# Patient Record
Sex: Female | Born: 1985 | Race: Black or African American | Hispanic: No | Marital: Single | Smoking: Current every day smoker
Health system: Southern US, Community
[De-identification: ages and names within clinical notes are randomized; demographics above are authoritative.]

## PROBLEM LIST (undated history)

## (undated) DIAGNOSIS — G43909 Migraine, unspecified, not intractable, without status migrainosus: Secondary | ICD-10-CM

## (undated) HISTORY — DX: Migraine, unspecified, not intractable, without status migrainosus: G43.909

## (undated) HISTORY — PX: TONSILLECTOMY: SUR1361

---

## 2004-08-12 ENCOUNTER — Emergency Department: Payer: Self-pay | Admitting: Emergency Medicine

## 2005-01-12 ENCOUNTER — Emergency Department: Payer: Self-pay | Admitting: Internal Medicine

## 2005-01-13 ENCOUNTER — Emergency Department: Payer: Self-pay | Admitting: Emergency Medicine

## 2005-05-10 ENCOUNTER — Ambulatory Visit: Payer: Self-pay | Admitting: Unknown Physician Specialty

## 2005-08-01 ENCOUNTER — Emergency Department: Payer: Self-pay | Admitting: Unknown Physician Specialty

## 2005-11-25 ENCOUNTER — Emergency Department: Payer: Self-pay | Admitting: Emergency Medicine

## 2006-04-30 ENCOUNTER — Emergency Department: Payer: Self-pay | Admitting: Unknown Physician Specialty

## 2007-05-04 ENCOUNTER — Observation Stay: Payer: Self-pay | Admitting: Obstetrics and Gynecology

## 2007-11-22 ENCOUNTER — Emergency Department: Payer: Self-pay | Admitting: Emergency Medicine

## 2008-05-05 ENCOUNTER — Emergency Department: Payer: Self-pay | Admitting: Emergency Medicine

## 2008-06-11 ENCOUNTER — Emergency Department: Payer: Self-pay | Admitting: Emergency Medicine

## 2008-07-05 ENCOUNTER — Emergency Department: Payer: Self-pay | Admitting: Unknown Physician Specialty

## 2008-07-24 ENCOUNTER — Emergency Department: Payer: Self-pay | Admitting: Emergency Medicine

## 2008-08-27 ENCOUNTER — Emergency Department: Payer: Self-pay | Admitting: Emergency Medicine

## 2008-09-04 ENCOUNTER — Emergency Department: Payer: Self-pay | Admitting: Emergency Medicine

## 2008-11-06 ENCOUNTER — Emergency Department: Payer: Self-pay | Admitting: Emergency Medicine

## 2008-12-18 ENCOUNTER — Emergency Department: Payer: Self-pay | Admitting: Emergency Medicine

## 2008-12-28 ENCOUNTER — Emergency Department: Payer: Self-pay | Admitting: Emergency Medicine

## 2009-03-06 ENCOUNTER — Emergency Department: Payer: Self-pay | Admitting: Emergency Medicine

## 2009-04-07 ENCOUNTER — Emergency Department: Payer: Self-pay | Admitting: Emergency Medicine

## 2009-04-12 ENCOUNTER — Emergency Department: Payer: Self-pay | Admitting: Emergency Medicine
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
## 2010-12-12 IMAGING — CR RIGHT FOREARM - 2 VIEW
1 series · 2 of 2 positions shown · non-contrast
Comparison: none

REASON FOR EXAM: pain
COMMENTS:

PROCEDURE:     DXR - DXR FOREARM RIGHT  - April 07, 2009  [DATE]
RESULT:     No fracture, dislocation or other acute bony abnormality is
identified.

[Series 1: view not recorded · 0.17mm/px · 2 of 2 slices shown]
[im 1/2]
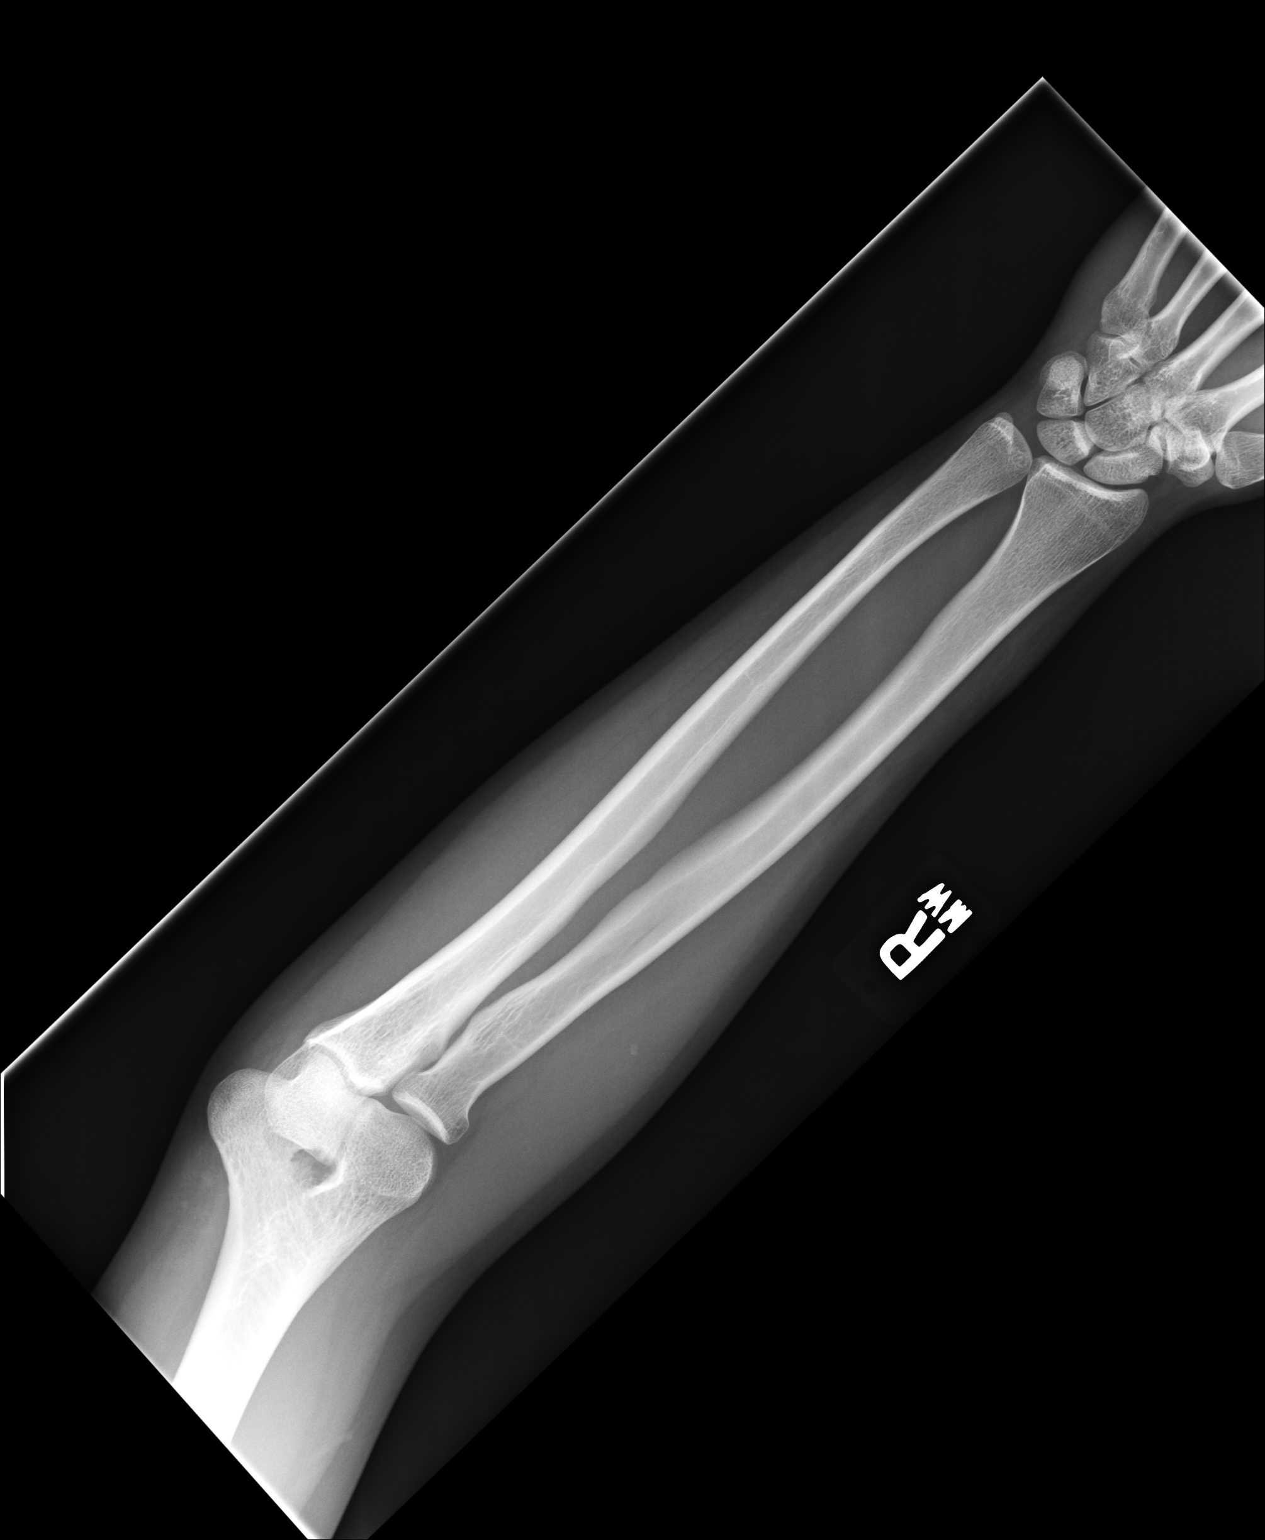
[im 2/2]
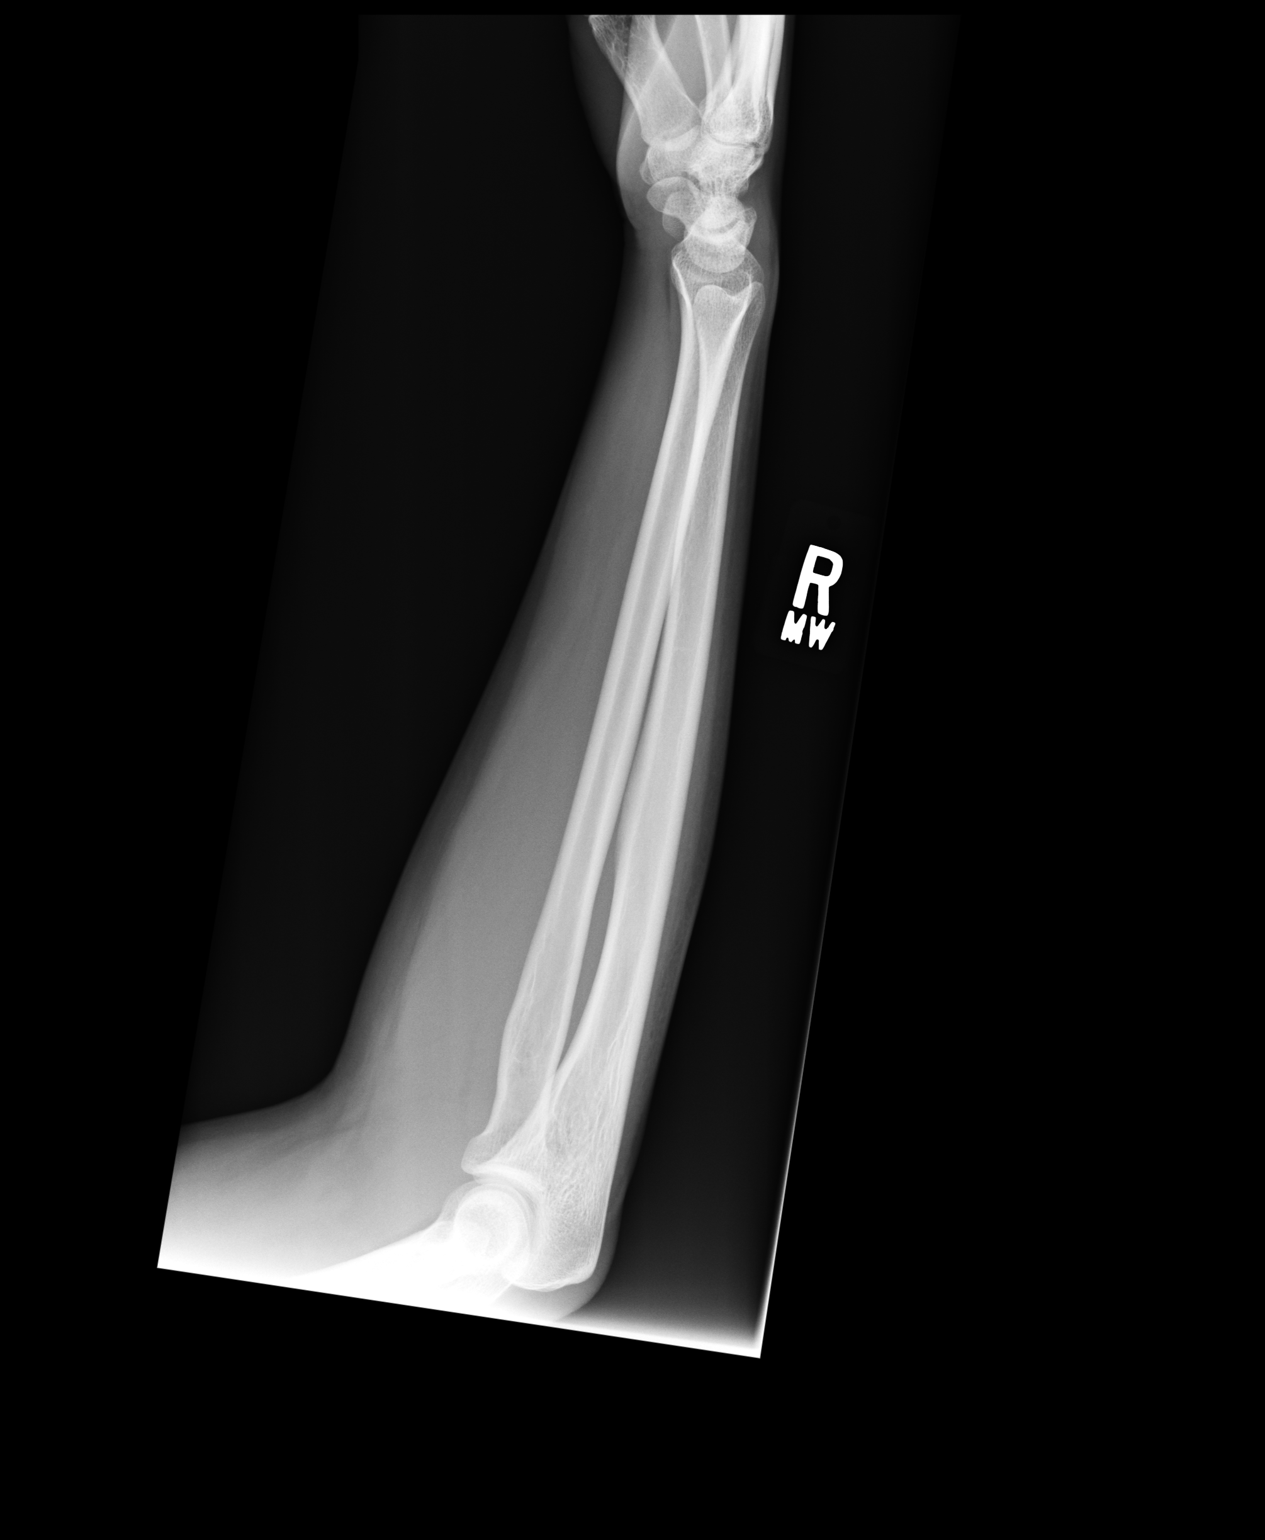

[2 of 2 positions shown; findings below may reference images not displayed]

IMPRESSION: 1.     No significant osseous abnormalities are noted.

## 2020-11-10 ENCOUNTER — Other Ambulatory Visit: Payer: Self-pay

## 2020-11-10 ENCOUNTER — Ambulatory Visit
Admission: EM | Admit: 2020-11-10 | Discharge: 2020-11-10 | Disposition: A | Discharge status: Home / Self Care | Payer: Self-pay | Attending: Family Medicine | Admitting: Family Medicine

## 2020-11-10 DIAGNOSIS — M7918 Myalgia, other site: Secondary | ICD-10-CM

## 2020-11-10 MED ORDER — TIZANIDINE HCL 4 MG PO TABS: 4.0000 mg | ORAL_TABLET | Freq: Three times a day (TID) | ORAL | 0 refills | Status: DC | PRN

## 2020-11-10 MED ORDER — MELOXICAM 15 MG PO TABS: 15.0000 mg | ORAL_TABLET | Freq: Every day | ORAL | 0 refills | Status: DC | PRN
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
## 2020-11-10 NOTE — Discharge Instructions (Addendum)
Rest.  Heat.  Medications as needed.  Take care  Dr. Adriana Simas

## 2020-11-18 ENCOUNTER — Encounter: Payer: Self-pay | Admitting: Advanced Practice Midwife

## 2020-11-18 ENCOUNTER — Ambulatory Visit: Payer: Self-pay

## 2020-11-18 ENCOUNTER — Ambulatory Visit (LOCAL_COMMUNITY_HEALTH_CENTER): Payer: Self-pay | Admitting: Advanced Practice Midwife

## 2020-11-18 ENCOUNTER — Other Ambulatory Visit: Payer: Self-pay

## 2020-11-18 VITALS — BP 122/69 | Ht 70.6 in | Wt 162.4 lb

## 2020-11-18 DIAGNOSIS — F17200 Nicotine dependence, unspecified, uncomplicated: Secondary | ICD-10-CM | POA: Insufficient documentation

## 2020-11-18 DIAGNOSIS — Z3009 Encounter for other general counseling and advice on contraception: Secondary | ICD-10-CM

## 2020-11-18 DIAGNOSIS — F1290 Cannabis use, unspecified, uncomplicated: Secondary | ICD-10-CM | POA: Insufficient documentation

## 2020-11-18 DIAGNOSIS — A599 Trichomoniasis, unspecified: Secondary | ICD-10-CM

## 2020-11-18 DIAGNOSIS — Z3046 Encounter for surveillance of implantable subdermal contraceptive: Secondary | ICD-10-CM

## 2020-11-18 LAB — HEMOGLOBIN, FINGERSTICK: Hemoglobin: 12.5 g/dL (ref 11.1–15.9)

## 2020-11-18 LAB — PREGNANCY, URINE: Preg Test, Ur: NEGATIVE

## 2020-11-18 LAB — HM HEPATITIS C SCREENING LAB: HM Hepatitis Screen: NEGATIVE

## 2020-11-18 LAB — WET PREP FOR TRICH, YEAST, CLUE
Trichomonas Exam: POSITIVE — AB
Yeast Exam: NEGATIVE

## 2020-11-18 LAB — HM HIV SCREENING LAB: HM HIV Screening: NEGATIVE

## 2020-11-18 MED ORDER — NORETHIN ACE-ETH ESTRAD-FE 1-20 MG-MCG PO TABS: 1.0000 | ORAL_TABLET | Freq: Every day | ORAL | 0 refills | Status: DC

## 2020-11-18 NOTE — Progress Notes (Signed)
Pt here for PE, Nexplanon removal/insertion.  Wet mount results reviewed, medication given per SO.  Pt declined condoms.  Pt rescheduled for Nexplanon removal/insertion for 8/18.  Pt given Microgestin FE 1/20 #1. Berdie Ogren, RN

## 2020-11-18 NOTE — Progress Notes (Signed)
[facility] DEPARTMENT [facility] [address] Main Number: [phone]    Family Planning Visit- Initial Visit  Subjective:  ANEESA ROMEY is a 35 y.o.  [id number]   being seen today for an initial annual visit and to discuss contraceptive options.  The patient is currently using Nexplanon for pregnancy prevention. Patient reports she does not want a pregnancy in the next year.  Patient has the following medical conditions has Smoker 1 ppd on their problem list.  Chief Complaint  Patient presents with   Annual Exam    PE, Nexplanon removal/insertion    Patient reports here for Nexplanon removal/reinsertion and physical. Nexplanon inserted at [facility] and pt states they told her it was good for 5 years. LMP 10/29/20. Last sex 11/13/20 without condom; with current partner x 8 years; 1 sex partner in last 3 mo. Daily MJ 1 gm. Smoking 1 ppd. Last cigar 20 years ago. Pt states last pap 2021 [facility] but not in Epic. Last ETOH 20 years ago. Not working. Not in school. Living with her mom and her 2 kids.  Patient denies vaping, cigars, ETOH  Body mass index is 22.91 kg/m. - Patient is eligible for diabetes screening based on BMI and age >16?  not applicable HA1C ordered? not applicable  Patient reports 1  partner/s in last year. Desires STI screening?  Yes  Has patient been screened once for HCV in the past?  No  No results found for: HCVAB  Does the patient have current drug use (including MJ), have a partner with drug use, and/or has been incarcerated since last result? Yes  If yes-- Screen for HCV through [facility]   Does the patient meet criteria for HBV testing? Yes  Criteria:  -Household, sexual or needle sharing contact with HBV -History of drug use -HIV positive -Those with known Hep C   Health Maintenance Due  Topic Date Due   COVID-19 Vaccine (1) Never done   Pneumococcal Vaccine 58-63 Years old (1 - PCV) Never done    HIV Screening  Never done   Hepatitis C Screening  Never done   TETANUS/TDAP  Never done   PAP SMEAR-Modifier  Never done   INFLUENZA VACCINE  11/15/2020    Review of Systems  All other systems reviewed and are negative.  The following portions of the patient's history were reviewed and updated as appropriate: allergies, current medications, past family history, past medical history, past social history, past surgical history and problem list. Problem list updated.   See flowsheet for other program required questions.  Objective:   Vitals:   11/18/20 1037  BP: 122/69  Weight: 162 lb 6.4 oz (73.7 kg)  Height: 5' 10.6" (1.793 m)    Physical Exam Constitutional:      Appearance: Normal appearance. She is normal weight.  HENT:     Head: Normocephalic and atraumatic.     Mouth/Throat:     Mouth: Mucous membranes are moist.     Comments: Last dental exam 6 mo ago Eyes:     Conjunctiva/sclera: Conjunctivae normal.  Neck:     Thyroid: No thyroid mass, thyromegaly or thyroid tenderness.  Cardiovascular:     Rate and Rhythm: Normal rate and regular rhythm.  Pulmonary:     Effort: Pulmonary effort is normal.     Breath sounds: Normal breath sounds.  Chest:  Breasts:    Right: Normal.     Left: Normal.  Abdominal:  Palpations: Abdomen is soft.     Comments: Soft, good tone, without masses or tenderness  Genitourinary:    General: Normal vulva.     Exam position: Lithotomy position.     Vagina: Vaginal discharge (white creamy leukorrhea, ph<4.5) present.     Cervix: Normal.     Uterus: Normal.      Adnexa: Right adnexa normal and left adnexa normal.     Rectum: Normal.     Comments: Pap done Musculoskeletal:        General: Normal range of motion.     Cervical back: Normal range of motion and neck supple.  Skin:    General: Skin is warm and dry.  Neurological:     Mental Status: She is alert.  Psychiatric:        Mood and Affect: Mood normal.       Assessment and Plan:  BILLIEJEAN SCHIMEK is a 35 y.o. female presenting to the [facility] Department for an initial annual wellness/contraceptive visit  Contraception counseling: Reviewed all forms of birth control options in the tiered based approach. available including abstinence; over the counter/barrier methods; hormonal contraceptive medication including pill, patch, ring, injection,contraceptive implant, ECP; hormonal and nonhormonal IUDs; permanent sterilization options including vasectomy and the various tubal sterilization modalities. Risks, benefits, and typical effectiveness rates were reviewed.  Questions were answered.  Written information was also given to the patient to review.  Patient desires Nexplanon removal/reinsertion, this was not prescribed for patient. She will follow up in  12/02/20 for surveillance.  She was told to call with any further questions, or with any concerns about this method of contraception.  Emphasized use of condoms 100% of the time for STI prevention.  Patient was offered ECP. ECP was not accepted by the patient. ECP counseling was not given - see RN documentation  1. Encounter for other general counseling and advice on contraception PT neg today - Pregnancy, urine  2. Family planning LMP 10/29/20. Last sex 11/13/20 without condom. Pt states [facility] told her Nexplanon was good for 5 years.  Because of chance of pregnancy, will wait for reinsertion until sure she is not pregnant Please give pt [employer] brochure LoEstrin #1 I po daily to begin today. Please counsel pt on taking daily at same time and abstinance/back up condoms next 7 days and until Nexplanon reinserted - WET PREP FOR TRICH, YEAST, CLUE - Hemoglobin, venipuncture - Syphilis Serology, [facility] - HIV/HCV [facility] - Chlamydia/Gonorrhea  Lab - IGP, Aptima HPV  3. Encounter for surveillance of implantable subdermal contraceptive Nexplanon inserted 11/2015  at [facility] per pt. Because of chance of pregnancy, pt opts to begin ocp's and abstain until does PT 11/27/20. Will then return week of 11/29/20 for Nexplanon removal/reinsertion. Pt states can return 12/02/20 for Nexplanon removal/reinsertion--please assist with scheduling     No follow-ups on file.  No future appointments.  Alberteen Spindle, CNM

## 2020-11-23 ENCOUNTER — Encounter: Payer: Self-pay | Admitting: Advanced Practice Midwife

## 2020-11-23 DIAGNOSIS — A5901 Trichomonal vulvovaginitis: Secondary | ICD-10-CM | POA: Insufficient documentation

## 2020-11-23 LAB — IGP, APTIMA HPV
HPV Aptima: NEGATIVE
PAP Smear Comment: 0

## 2020-11-23 MED ORDER — METRONIDAZOLE 500 MG PO TABS: 500.0000 mg | ORAL_TABLET | Freq: Two times a day (BID) | ORAL | 0 refills | Status: AC

## 2020-11-23 NOTE — Addendum Note (Signed)
Addended by: Berdie Ogren on: 11/23/2020 08:35 AM   Modules accepted: Orders

## 2020-11-23 NOTE — Progress Notes (Signed)
Metronidazole given at patient appointment. Order not added the day  of her appointment.  Order added today. Berdie Ogren, RN

## 2020-11-24 ENCOUNTER — Other Ambulatory Visit: Payer: Self-pay

## 2020-11-24 ENCOUNTER — Ambulatory Visit: Payer: Self-pay

## 2020-11-24 DIAGNOSIS — A549 Gonococcal infection, unspecified: Secondary | ICD-10-CM

## 2020-11-24 MED ORDER — CEFTRIAXONE SODIUM 500 MG IJ SOLR
500.0000 mg | Freq: Once | INTRAMUSCULAR | Status: AC
Start: 2020-11-24 — End: 2020-11-24
  Administered 2020-11-24: 500 mg via INTRAMUSCULAR

## 2020-11-24 NOTE — Progress Notes (Signed)
In Nurse Clinic for gonorrhea tx. Pt reports nausea and vomiting 10 hrs after taking metronidazole. This was prescribed at visit on 11/18/2020 to treat trich. Pt states she is taking one pill of Metronidazole daily instead of twice daily and has approx 7 pills left in bottle. Consult Beatris Si, PA who advises RN to counsel pt to take the metronidazole with flavored drink (ex gatorade/koolaid) and take pill with large meal. Provider recommends that pt take the metronidazole exactly as prescribed for optimal benefit. May take OTC nausea med (ex. Dramamine, benadryl) 30-45 min before dose, but should not drive with these meds as they may cause drowsiness. RN carried out provider orders. Pt in agreement. Advised to call if questions/concerns. Treated for gonorrhea today with Ceftriaxone 500 mg IM once per SO Dr Karyl Kinnier. Pt stayed for 20 min observation without problem. Jerel Shepherd, RN

## 2020-11-25 NOTE — Progress Notes (Signed)
Consulted by RN re: patient situation.  Reviewed RN note and agree that it reflects our discussion and my recommendations. 

## 2020-12-02 ENCOUNTER — Other Ambulatory Visit: Payer: Self-pay

## 2020-12-02 ENCOUNTER — Encounter: Payer: Self-pay | Admitting: Physician Assistant

## 2020-12-02 ENCOUNTER — Ambulatory Visit: Payer: Self-pay | Admitting: Physician Assistant

## 2020-12-02 ENCOUNTER — Ambulatory Visit (LOCAL_COMMUNITY_HEALTH_CENTER): Payer: Self-pay | Admitting: Physician Assistant

## 2020-12-02 VITALS — BP 97/61 | HR 69 | Temp 97.7°F | Ht 71.0 in | Wt 161.4 lb

## 2020-12-02 DIAGNOSIS — Z3009 Encounter for other general counseling and advice on contraception: Secondary | ICD-10-CM

## 2020-12-02 DIAGNOSIS — Z3046 Encounter for surveillance of implantable subdermal contraceptive: Secondary | ICD-10-CM

## 2020-12-02 DIAGNOSIS — Z30017 Encounter for initial prescription of implantable subdermal contraceptive: Secondary | ICD-10-CM

## 2020-12-02 MED ORDER — ETONOGESTREL 68 MG ~~LOC~~ IMPL
68.0000 mg | DRUG_IMPLANT | Freq: Once | SUBCUTANEOUS | Status: AC
  Administered 2020-12-02: 68 mg via SUBCUTANEOUS

## 2020-12-02 NOTE — Progress Notes (Signed)
Please refer to Nexplanon removal encounter for today's appt documentation. Jossie Ng, RN

## 2020-12-02 NOTE — Progress Notes (Signed)
S: Pt presents for procedure visit only; Nexplanon removal and reinsertion. Has allergy to codeine only, this causes rash (no SOB, anaphylaxis). This will be her 3rd or 4th Nexplanon, last insertion done at [facility]. Had recent annual well woman exam here. O: pleasant woman in no distress. 40mm firm straight subcutaneous object palpable L upper inner arm.  A/P: Nexplanon Removal and Insertion  Patient identified, informed consent performed, consent signed.   Patient does understand that irregular bleeding is a very common side effect of this medication. She was advised to have backup contraception for one week after replacement of the implant. Patient deemed to meet WHO criteria for being reasonably certain she is not pregnant (neg home PT 11/27/20).  Appropriate time out taken. Nexplanon site identified in the patient's L arm. Area prepped in usual sterile fashon. 2.5 ml of 1% lidocaine with epinephrine was used to anesthetize the area at the distal end of the implant. A small stab incision was made right beside the implant on the distal portion. The Nexplanon rod was grasped using hemostats and removed without difficulty. There was minimal blood loss. There were no complications.   Confirmed correct location of insertion site. The insertion site was identified 8-10 cm (3-4 inches) from the medial epicondyle of the humerus and 3-5 cm (1.25-2 inches) posterior to (below) the sulcus (groove) between the biceps and triceps muscles of the patient's left arm. New Nexplanon removed from packaging, Device confirmed in needle, then inserted full length of needle and withdrawn per handbook instructions. Nexplanon was able to palpated in the patient's Left arm; patient palpated the insert herself.  There was minimal blood loss. Patient insertion site covered with guaze and a pressure bandage to reduce any bruising. The patient tolerated the procedure well and was given post procedure  instructions.    Encourage condoms for STI prevention. Recommend repeat annual well woman exam in 12 mo.

## 2020-12-02 NOTE — Progress Notes (Signed)
Refer to Nexplanon insertion encounter for documentation of today's visit. Jossie Ng, RN

## 2020-12-02 NOTE — Addendum Note (Signed)
Addended by: Veatrice Kells on: 12/02/2020 05:28 PM   Modules accepted: Orders

## 2021-03-06 ENCOUNTER — Other Ambulatory Visit: Payer: Self-pay

## 2021-03-06 ENCOUNTER — Ambulatory Visit
Admission: EM | Admit: 2021-03-06 | Discharge: 2021-03-06 | Disposition: A | Discharge status: Home / Self Care | Payer: Self-pay | Attending: Emergency Medicine | Admitting: Emergency Medicine

## 2021-03-06 DIAGNOSIS — S39012A Strain of muscle, fascia and tendon of lower back, initial encounter: Secondary | ICD-10-CM

## 2021-03-06 DIAGNOSIS — M5442 Lumbago with sciatica, left side: Secondary | ICD-10-CM

## 2021-03-06 MED ORDER — PREDNISONE 10 MG (21) PO TBPK: ORAL_TABLET | ORAL | 0 refills | Status: DC

## 2021-03-06 MED ORDER — BACLOFEN 10 MG PO TABS: 10.0000 mg | ORAL_TABLET | Freq: Three times a day (TID) | ORAL | 0 refills | Status: DC
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
## 2021-03-06 NOTE — Discharge Instructions (Signed)
Take the prednisone daily with breakfast. Start today when you fill the prescription.  Take the baclofen, 10 mg every 8 hours, on a schedule for the next 48 hours and then as needed.  Apply moist heat to your back for 30 minutes at a time 2-3 times a day to improve blood flow to the area and help remove the lactic acid causing the spasm.  Follow the back exercises given at discharge.  Return for reevaluation for any new or worsening symptoms.

## 2021-07-07 ENCOUNTER — Encounter: Payer: Self-pay | Admitting: Emergency Medicine

## 2021-07-07 ENCOUNTER — Other Ambulatory Visit: Payer: Self-pay

## 2021-07-07 ENCOUNTER — Ambulatory Visit
Admission: EM | Admit: 2021-07-07 | Discharge: 2021-07-07 | Disposition: A | Discharge status: Home / Self Care | Payer: Self-pay | Attending: Physician Assistant | Admitting: Physician Assistant

## 2021-07-07 DIAGNOSIS — R0981 Nasal congestion: Secondary | ICD-10-CM | POA: Insufficient documentation

## 2021-07-07 DIAGNOSIS — B349 Viral infection, unspecified: Secondary | ICD-10-CM | POA: Insufficient documentation

## 2021-07-07 DIAGNOSIS — H9202 Otalgia, left ear: Secondary | ICD-10-CM | POA: Insufficient documentation

## 2021-07-07 DIAGNOSIS — J029 Acute pharyngitis, unspecified: Secondary | ICD-10-CM | POA: Insufficient documentation

## 2021-07-07 DIAGNOSIS — Z20822 Contact with and (suspected) exposure to covid-19: Secondary | ICD-10-CM | POA: Insufficient documentation

## 2021-07-07 LAB — GROUP A STREP BY PCR: Group A Strep by PCR: NOT DETECTED

## 2021-07-07 NOTE — Discharge Instructions (Addendum)
URI/COLD SYMPTOMS: Your exam today is consistent with a viral illness. Antibiotics are not indicated at this time. Use medications as directed, including cough syrup, nasal saline, and decongestants. Your symptoms should improve over the next few days and resolve within 7-10 days. Increase rest and fluids. F/u if symptoms worsen or predominate such as sore throat, ear pain, productive cough, shortness of breath, or if you develop high fevers or worsening fatigue over the next several days.   ? ?-OTC Mucinex, flonase, ibuprofen/Tylenol ?-If COVID +, he needs isolate 5 days from symptom onset and wear mask for 5 days. ?

## 2021-07-07 NOTE — ED Triage Notes (Signed)
Pt c/o left ear pain, and left sided throat pain. Started yesterday. Denies fever.  ?

## 2021-07-07 NOTE — ED Provider Notes (Signed)
[facility] ?[facility] ? ? ? ?CSN: [id number] ?Arrival date & time: 07/07/21  2725 ? ? ?  ? ?History   ?Chief Complaint ?Chief Complaint  ?Patient presents with  ? Otalgia  ?  left  ? ? ?HPI ?Melissa Wong is a 36 y.o. female presenting for left ear pain, left sided sore throat and nasal congestion.  Symptom onset was yesterday.  No associated fever, fatigue, body aches, cough, breathing difficulty, nausea/vomiting or diarrhea.  No sick contacts.  Not taking a medicine for symptoms.  No other complaints. ? ?HPI ? ?Past Medical History:  ?Diagnosis Date  ? Migraines   ? ? ?Patient Active Problem List  ? Diagnosis Date Noted  ? Trichomonas vaginitis 11/18/20 11/23/2020  ? Smoker 1 ppd 11/18/2020  ? Marijuana use 11/18/2020  ? ? ?Past Surgical History:  ?Procedure Laterality Date  ? TONSILLECTOMY    ? ? ?OB History   ? ? Gravida  ?6  ? Para  ?   ? Term  ?   ? Preterm  ?   ? AB  ?4  ? Living  ?2  ?  ? ? SAB  ?   ? IAB  ?   ? Ectopic  ?   ? Multiple  ?   ? Live Births  ?   ?   ?  ?  ? ? ? ?Home Medications   ? ?Prior to Admission medications   ?Not on File  ? ? ?Family History ?Family History  ?Problem Relation Age of Onset  ? Diabetes Mother   ? Kidney disease Mother   ? ? ?Social History ?Social History  ? ?Tobacco Use  ? Smoking status: Every Day  ?  Packs/day: 1.00  ?  Types: Cigarettes  ? Smokeless tobacco: Never  ?Vaping Use  ? Vaping Use: Never used  ?Substance Use Topics  ? Alcohol use: Not Currently  ?  Comment: last use 20 years ago  ? Drug use: Yes  ?  Types: Marijuana  ?  Comment: 1 gm daily  ? ? ? ?Allergies   ?Codeine ? ? ?Review of Systems ?Review of Systems  ?Constitutional:  Negative for chills, diaphoresis, fatigue and fever.  ?HENT:  Positive for congestion, ear pain, rhinorrhea and sore throat. Negative for sinus pressure and sinus pain.   ?Respiratory:  Negative for cough and shortness of breath.   ?Gastrointestinal:  Negative for abdominal pain, nausea and vomiting.  ?Musculoskeletal:  Negative  for arthralgias and myalgias.  ?Skin:  Negative for rash.  ?Neurological:  Negative for weakness and headaches.  ?Hematological:  Negative for adenopathy.  ? ? ?Physical Exam ?Triage Vital Signs ?ED Triage Vitals  ?Enc Vitals Group  ?   BP   ?   Pulse   ?   Resp   ?   Temp   ?   Temp src   ?   SpO2   ?   Weight   ?   Height   ?   Head Circumference   ?   Peak Flow   ?   Pain Score   ?   Pain Loc   ?   Pain Edu?   ?   Excl. in GC?   ? ?No data found. ? ?Updated Vital Signs ?BP 122/60 (BP Location: Right Arm)   Pulse 63   Temp 98.4 ?F (36.9 ?C) (Oral)   Resp 18   Ht 5' 10.5" (1.791 m)   Wt 167 lb 1.7  oz (75.8 kg)   SpO2 100%   BMI 23.64 kg/m?  ?   ? ?Physical Exam ?Vitals and nursing note reviewed.  ?Constitutional:   ?   General: She is not in acute distress. ?   Appearance: Normal appearance. She is not ill-appearing or toxic-appearing.  ?HENT:  ?   Head: Normocephalic and atraumatic.  ?   Right Ear: Tympanic membrane, ear canal and external ear normal.  ?   Left Ear: Tympanic membrane, ear canal and external ear normal.  ?   Nose: Nose normal.  ?   Mouth/Throat:  ?   Mouth: Mucous membranes are moist.  ?   Pharynx: Oropharynx is clear. Posterior oropharyngeal erythema (mild with clear PND) present.  ?Eyes:  ?   General: No scleral icterus.    ?   Right eye: No discharge.     ?   Left eye: No discharge.  ?   Conjunctiva/sclera: Conjunctivae normal.  ?Cardiovascular:  ?   Rate and Rhythm: Normal rate and regular rhythm.  ?   Heart sounds: Normal heart sounds.  ?Pulmonary:  ?   Effort: Pulmonary effort is normal. No respiratory distress.  ?   Breath sounds: Normal breath sounds.  ?Musculoskeletal:  ?   Cervical back: Neck supple.  ?Skin: ?   General: Skin is dry.  ?Neurological:  ?   General: No focal deficit present.  ?   Mental Status: She is alert. Mental status is at baseline.  ?   Motor: No weakness.  ?   Gait: Gait normal.  ?Psychiatric:     ?   Mood and Affect: Mood normal.     ?   Behavior: Behavior  normal.     ?   Thought Content: Thought content normal.  ? ? ? ?UC Treatments / Results  ?Labs ?(all labs ordered are listed, but only abnormal results are displayed) ?Labs Reviewed  ?GROUP A STREP BY PCR  ?SARS CORONAVIRUS 2 (TAT 6-24 HRS)  ? ? ?EKG ? ? ?Radiology ?No results found. ? ?Procedures ?Procedures (including critical care time) ? ?Medications Ordered in UC ?Medications - No data to display ? ?Initial Impression / Assessment and Plan / UC Course  ?I have reviewed the triage vital signs and the nursing notes. ? ?Pertinent labs & imaging results that were available during my care of the patient were reviewed by me and considered in my medical decision making (see chart for details). ? ?36 year old female presenting for sore throat, left sided ear pain and nasal congestion.  Symptom onset yesterday.  No associated fever, cough or other symptoms.  Vitals normal and stable.  Patient overall well-appearing.  HEENT exam significant for mild posterior pharyngeal erythema with clear postnasal drainage, otherwise normal.  Chest clear to auscultation.  PCR strep test obtained and negative.  Discussed result patient.  PCR COVID test obtained.  Current CDC guidelines, isolation protocol and ED precautions reviewed if positive.  Advised symptoms consistent with a virus.  May expect to be sick for about 7 to 10 days.  Supportive care encouraged with increasing rest and fluids, Mucinex, Flonase, ibuprofen and Tylenol and cough drops as needed.  Work note given. ? ? ?Final Clinical Impressions(s) / UC Diagnoses  ? ?Final diagnoses:  ?Viral illness  ?Otalgia of left ear  ?Sore throat  ? ? ? ?Discharge Instructions   ? ?  ?URI/COLD SYMPTOMS: Your exam today is consistent with a viral illness. Antibiotics are not indicated at this time. Use medications as  directed, including cough syrup, nasal saline, and decongestants. Your symptoms should improve over the next few days and resolve within 7-10 days. Increase rest and  fluids. F/u if symptoms worsen or predominate such as sore throat, ear pain, productive cough, shortness of breath, or if you develop high fevers or worsening fatigue over the next several days.   ? ?-OTC Mucinex, flonase, ibuprofen/Tylenol ?-If COVID +, he needs isolate 5 days from symptom onset and wear mask for 5 days. ? ? ? ? ?ED Prescriptions   ?None ?  ? ?PDMP not reviewed this encounter. ?  ?Shirlee Latch, PA-C ?07/07/21 1021 ? ?

## 2021-07-08 LAB — SARS CORONAVIRUS 2 (TAT 6-24 HRS): SARS Coronavirus 2: NEGATIVE

## 2021-07-27 ENCOUNTER — Other Ambulatory Visit: Payer: Self-pay | Admitting: Family Medicine

## 2021-07-27 DIAGNOSIS — R1310 Dysphagia, unspecified: Secondary | ICD-10-CM

## 2021-07-27 DIAGNOSIS — E040 Nontoxic diffuse goiter: Secondary | ICD-10-CM

## 2021-08-05 ENCOUNTER — Ambulatory Visit: Admission: RE | Admit: 2021-08-05 | Payer: Self-pay | Source: Ambulatory Visit

## 2023-07-30 ENCOUNTER — Ambulatory Visit
Admission: EM | Admit: 2023-07-30 | Discharge: 2023-07-30 | Disposition: A | Discharge status: Home / Self Care | Attending: Family Medicine | Admitting: Family Medicine

## 2023-07-30 DIAGNOSIS — S0990XA Unspecified injury of head, initial encounter: Secondary | ICD-10-CM | POA: Insufficient documentation

## 2023-07-30 DIAGNOSIS — N939 Abnormal uterine and vaginal bleeding, unspecified: Secondary | ICD-10-CM | POA: Insufficient documentation

## 2023-07-30 LAB — CBC WITH DIFFERENTIAL/PLATELET
Abs Immature Granulocytes: 0.01 10*3/uL (ref 0.00–0.07)
Basophils Absolute: 0.1 10*3/uL (ref 0.0–0.1)
Basophils Relative: 1 %
Eosinophils Absolute: 0.1 10*3/uL (ref 0.0–0.5)
Eosinophils Relative: 1 %
HCT: 37 % (ref 36.0–46.0)
Hemoglobin: 12.3 g/dL (ref 12.0–15.0)
Immature Granulocytes: 0 %
Lymphocytes Relative: 30 %
Lymphs Abs: 2 10*3/uL (ref 0.7–4.0)
MCH: 30.5 pg (ref 26.0–34.0)
MCHC: 33.2 g/dL (ref 30.0–36.0)
MCV: 91.8 fL (ref 80.0–100.0)
Monocytes Absolute: 0.6 10*3/uL (ref 0.1–1.0)
Monocytes Relative: 9 %
Neutro Abs: 3.9 10*3/uL (ref 1.7–7.7)
Neutrophils Relative %: 59 %
Platelets: 356 10*3/uL (ref 150–400)
RBC: 4.03 MIL/uL (ref 3.87–5.11)
RDW: 12.4 % (ref 11.5–15.5)
WBC: 6.7 10*3/uL (ref 4.0–10.5)
nRBC: 0 % (ref 0.0–0.2)

## 2023-07-30 LAB — PREGNANCY, URINE: Preg Test, Ur: NEGATIVE

## 2023-07-30 MED ORDER — NORETHINDRONE ACETATE 5 MG PO TABS: 5.0000 mg | ORAL_TABLET | Freq: Every day | ORAL | 0 refills | Status: AC

## 2023-07-30 NOTE — Discharge Instructions (Signed)
 You are not anemic and are not pregnant.  Stop by the pharmacy to pick up your prescriptions.  Follow up with your primary care provider or gynecologist, if not improving.
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
## 2023-11-08 ENCOUNTER — Other Ambulatory Visit: Payer: Self-pay | Admitting: Family Medicine

## 2023-11-08 ENCOUNTER — Ambulatory Visit: Admitting: Family Medicine

## 2023-11-08 ENCOUNTER — Encounter: Payer: Self-pay | Admitting: Family Medicine

## 2023-11-08 DIAGNOSIS — Z3009 Encounter for other general counseling and advice on contraception: Secondary | ICD-10-CM

## 2023-11-08 DIAGNOSIS — Z309 Encounter for contraceptive management, unspecified: Secondary | ICD-10-CM

## 2023-11-08 DIAGNOSIS — A5901 Trichomonal vulvovaginitis: Secondary | ICD-10-CM

## 2023-11-08 DIAGNOSIS — N6315 Unspecified lump in the right breast, overlapping quadrants: Secondary | ICD-10-CM

## 2023-11-08 DIAGNOSIS — Z113 Encounter for screening for infections with a predominantly sexual mode of transmission: Secondary | ICD-10-CM

## 2023-11-08 LAB — WET PREP FOR TRICH, YEAST, CLUE
Clue Cell Exam: POSITIVE — AB
Trichomonas Exam: POSITIVE — AB
Yeast Exam: NEGATIVE

## 2023-11-08 LAB — HM HIV SCREENING LAB: HM HIV Screening: NEGATIVE

## 2023-11-08 MED ORDER — METRONIDAZOLE 500 MG PO TABS: 500.0000 mg | ORAL_TABLET | Freq: Two times a day (BID) | ORAL | Status: AC

## 2023-11-08 NOTE — Progress Notes (Signed)
 [employer] [facility] DEPART[facility] [address], [address] [place] [address] Main phone: [phone]  Family Planning Visit - Repeat Yearly Visit  Subjective:  Melissa Wong is a 38 y.o. [id number]  being seen today for an annual wellness visit and to discuss contraception options. The patient is currently using hormonal implant for pregnancy prevention. Patient does not want a pregnancy in the next year. Has one more year before Nexplanon  expires (placed in 2022).  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 No major concerns today. Does c/o dark urine, but states she does not drink very much water. No breast or vaginal concerns.   Review of Systems  All other systems reviewed and are negative.  See flowsheet for further details and programmatic requirements Hyperlink available at the top of the signed note in blue.  Flow sheet content below:  Contraception History Past methods of contraception used by patient:: Hormonal Implant, Contraceptive Pill, Hormonal Injection Adverse effects associated with Contraceptive Pill: no Adverse effects associated with Hormonal Injection: excessive  bleeding Adverse effects associated with Hormonal Implant: no Sexual History What age did you start your period?: 12 How often do you have your period?: monthly Date of last sex?: 10/18/23 Has the patient had unprotected sex within the last 5 days?: No Do you have sex with men, women, both men and women?: Men only In the past 2 months how many partners have you had sex with?: 1 In the past 12 months, how many partners have you had sex with?: 1 Is it possible that any of your sex partners in the  past 12 months had sex with someone else whild they were still in a sexual relationship with you?: Yes What ways do you have sex?: Vaginal, Oral, Anal Do you or your partner use condoms and/or dental dams every time you have vaginal, oral or anal sex?: No Do you douche?: Yes How often?: 1x a yr Last time?: 2024 Date of last HIV test?:  (per pt, 2024) Have you ever had an STD?: Yes Have any of your partners had an STD?: Yes Partner Previous STD?: Gonorrhea, Chlamydia Date?:  (3 yrs ago) Have you or your partner ever shot up drugs?: No Have any of your partners used drugs in the past?: Yes (marijuana) Have you or your partners exchanged money or drugs for sex?: No Risk Factors for Hep B Household, sexual, or needle sharing contact of a person infected with Hep B: No Sexual contact with a person who uses drugs not as prescribed?: No Currently or Ever used drugs not as prescribed: No HIV Positive: No PRep Patient: No Men who have sex with men: No Have Hepatitis C: No History of Incarceration: No History of Homeslessness?: No Anal sex following anal drug use?: No Risk Factors for Hep C Currently using drugs not as prescribed: No Sexual partner(s) currently using drugs as not prescribed: No History of drug use: No HIV Positive: No People with a history of incarceration: No People born between the years of 61 and 53: No  Diabetes screening This patient is 38 y.o. with a BMI of There is no height or weight on file to calculate BMI.SABRA  Is patient eligible for diabetes screening (age >35 and BMI >25)?  no  Was Hgb A1c ordered? no  STI screening Patient reports 1 of partners in last year.  Does this patient desire STI screening?  Yes  Hepatitis C screening Has patient been screened once for HCV in the past?  Yes  No results found for: HCVAB  Does the patient meet criteria for HCV testing? No  (If yes-- Screen for HCV through [facility]) Criteria:  Since the last HCV  result, does the patient have any of the following? - Current drug use - Have a partner with drug use - Has been incarcerated  Hepatitis B screening Does the patient meet criteria for HBV testing? No Criteria:  -Household, sexual or needle sharing contact with HBV -History of drug use -HIV positive -Those with known Hep C  Cervical Cancer Screening  Result Date Procedure Results Follow-ups  11/18/2020 IGP, Aptima HPV DIAGNOSIS:: Comment Specimen adequacy:: Comment Clinician Provided ICD10: Comment Performed by:: Comment Electronically signed by:: Comment PAP Smear Comment: . PATHOLOGIST PROVIDED ICD10:: Comment Note:: Comment Test Methodology: Comment HPV Aptima: Negative    Health Maintenance Due  Topic Date Due   Hepatitis B Vaccines (2 of 3 - 3-dose series) 11/13/1995   DTaP/Tdap/Td (1 - Tdap) Never done   Pneumococcal Vaccine 69-93 Years old (1 of 2 - PCV) Never done   HPV VACCINES (1 - 3-dose SCDM series) Never done   COVID-19 Vaccine (3 - 2024-25 season) 12/17/2022    The following portions of the patient's history were reviewed and updated as appropriate: allergies, current medications, past family history, past medical history, past social history, past surgical history and problem list. Problem list updated.  Objective:  There were no vitals filed for this visit.  Physical Exam Exam conducted with a chaperone present Brett Orange CNA).  Constitutional:      Appearance: Normal appearance.  HENT:     Head: Normocephalic.     Mouth/Throat:     Mouth: Mucous membranes are moist.     Pharynx: No oropharyngeal exudate or posterior oropharyngeal erythema.  Eyes:     General: No scleral icterus.       Right eye: No discharge.        Left eye: No discharge.  Cardiovascular:     Rate and Rhythm: Normal rate and regular rhythm.  Pulmonary:     Effort: Pulmonary effort is normal.     Breath sounds: Normal breath sounds.  Chest:  Breasts:    Right: Mass present.  No swelling, bleeding, inverted nipple, nipple discharge, skin change or tenderness.     Left: No swelling, bleeding, inverted nipple, mass, nipple discharge, skin change or tenderness.       Comments: Red circle represents a firm, nontender, mobile lump about 1 cm from the nipple. Approx 1.5 x 1.5 cm Genitourinary:    General: Normal vulva.     Labia:        Right: No rash, tenderness, lesion or injury.        Left: No rash, tenderness, lesion or injury.      Vagina: Normal. No vaginal discharge, tenderness or lesions.     Cervix: No discharge, friability, lesion, erythema or cervical bleeding.  Lymphadenopathy:     Cervical: No cervical adenopathy.     Right cervical: No superficial or posterior cervical adenopathy.    Left cervical: No superficial or posterior cervical adenopathy.     Upper Body:  Right upper body: No supraclavicular or axillary adenopathy.     Left upper body: No supraclavicular or axillary adenopathy.  Skin:    General: Skin is warm and dry.  Neurological:     Mental Status: She is alert.  Psychiatric:        Mood and Affect: Mood normal.        Behavior: Behavior normal.    Assessment and Plan:  WILLAMINA GRIESHOP is a 38 y.o. female [phone] presenting to the [facility] Department for an yearly wellness and contraception visit  1. Screening for venereal disease (Primary)  - WET PREP FOR TRICH, YEAST, CLUE - Chlamydia/Gonorrhea [place] [facility] - HIV [facility] - Syphilis Serology, [facility]  2. Mass overlapping multiple quadrants of right breast  - Firm, nontender, mobile lump noted about 1 cm from the nipple. Approx 1.5 x 1.5 cm. Patient agrees to a mammogram. - MM Digital Diagnostic Bilat; Future  3. Family planning  Contraception counseling:  Reviewed options based on patient desire and reproductive life plan. Patient is interested in maintaining her Hormonal Implant.  Risks, benefits, and typical effectiveness rates were reviewed.   Questions were answered.  Written information was also given to the patient to review.    The patient will follow up in  1 years for surveillance.  The patient was told to call with any further questions, or with any concerns about this method of contraception.  Emphasized use of condoms 100% of the time for STI prevention.  Emergency Contraception Precautions (ECP): Patient assessed for need of ECP. She is not a candidate based on LARC in place and unexpired.  4. Trichomonas vaginitis  - metroNIDAZOLE  (FLAGYL ) 500 MG tablet; Take 1 tablet (500 mg total) by mouth 2 (two) times daily for 7 days.   Return in about 1 year (around 11/07/2024).  No future appointments.  Damien FORBES Satchel [facility]

## 2023-11-08 NOTE — Progress Notes (Signed)
 Patient is here for family planning visit. Family planning education card given to patient. Wet prep results reviewed; patient treated with Metronidazole  500mg  #14 BID x 7 days per order by FORBES Satchel, NP. All questions answered and verbalizes understanding.   Melissa CINDERELLA Shuck, RN

## 2023-11-11 NOTE — Progress Notes (Signed)
 Duplicate note. Disregard.   Dorothyann Helling, MD 11/11/23  7:45 PM

## 2023-11-12 ENCOUNTER — Ambulatory Visit (INDEPENDENT_AMBULATORY_CARE_PROVIDER_SITE_OTHER)

## 2023-11-12 ENCOUNTER — Ambulatory Visit
Admission: EM | Admit: 2023-11-12 | Discharge: 2023-11-12 | Disposition: A | Discharge status: Home / Self Care | Attending: Physician Assistant | Admitting: Physician Assistant

## 2023-11-12 DIAGNOSIS — M545 Low back pain, unspecified: Secondary | ICD-10-CM

## 2023-11-12 DIAGNOSIS — M25551 Pain in right hip: Secondary | ICD-10-CM | POA: Diagnosis not present

## 2023-11-12 DIAGNOSIS — G8929 Other chronic pain: Secondary | ICD-10-CM

## 2023-11-12 MED ORDER — PREDNISONE 10 MG PO TABS: ORAL_TABLET | ORAL | 0 refills | Status: AC
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
## 2023-11-12 NOTE — Discharge Instructions (Signed)
-  Xray of hip does not show any abnormalities -I will call if back xray shows any significant abnormalities -You may  have a pinched nerve which we cannot see on xray. You really should have an MRI since this has been going on for a few months. -Start prednisone  taper -Apply ice, heat and stretch  BACK PAIN RED FLAGS: If the back pain acutely worsens or there are any red flag symptoms such as numbness/tingling, leg weakness, saddle anesthesia, or loss of bowel/bladder control, go immediately to the ER. Follow up with us  as scheduled or sooner if the pain does not begin to resolve or if it worsens before the follow up    You have a condition requiring you to follow up with Orthopedics so please call one of the following office for appointment:   [facility] Address: [address], [place], [place] [address] Phone: [phone]  [facility] [address] [place], [place] [address] Phone: [phone]  [facility] [address], [place], [place] [address] Phone: [phone]
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
## 2023-11-13 ENCOUNTER — Ambulatory Visit (HOSPITAL_COMMUNITY): Payer: Self-pay

## 2023-11-14 ENCOUNTER — Ambulatory Visit
Admission: RE | Admit: 2023-11-14 | Discharge: 2023-11-14 | Disposition: A | Discharge status: Home / Self Care | Source: Ambulatory Visit | Attending: Family Medicine | Admitting: Family Medicine

## 2023-11-14 DIAGNOSIS — N6315 Unspecified lump in the right breast, overlapping quadrants: Secondary | ICD-10-CM | POA: Insufficient documentation

## 2023-11-15 ENCOUNTER — Ambulatory Visit: Payer: Self-pay | Admitting: Family Medicine

## 2023-11-15 NOTE — Progress Notes (Signed)
 BIRADS 1, negative for concerning malignancies. Safe to resume routine age-related mammogram screenings.    Please mail results or send card.   Dorothyann Helling, MD 11/15/23  3:12 PM

## 2023-11-15 NOTE — Progress Notes (Signed)
 BIRADS 1, negative for concerning malignancies. Safe to resume routine age-related mammogram screenings.   Please mail results or send card.   Dorothyann Helling, MD 11/15/23  3:11 PM

## 2023-11-20 ENCOUNTER — Encounter: Payer: Self-pay | Admitting: Family Medicine
